# Patient Record
Sex: Male | Born: 1945 | Race: White | Hispanic: No | Marital: Married | Smoking: Never smoker
Health system: Southern US, Community
[De-identification: ages and names within clinical notes are randomized; demographics above are authoritative.]

## PROBLEM LIST (undated history)

## (undated) DIAGNOSIS — C801 Malignant (primary) neoplasm, unspecified: Secondary | ICD-10-CM

## (undated) DIAGNOSIS — N400 Enlarged prostate without lower urinary tract symptoms: Secondary | ICD-10-CM

## (undated) DIAGNOSIS — E119 Type 2 diabetes mellitus without complications: Secondary | ICD-10-CM

## (undated) DIAGNOSIS — C8190 Hodgkin lymphoma, unspecified, unspecified site: Secondary | ICD-10-CM

---

## 2016-08-14 ENCOUNTER — Other Ambulatory Visit: Payer: Self-pay

## 2016-08-14 ENCOUNTER — Inpatient Hospital Stay
Admission: EM | Admit: 2016-08-14 | Discharge: 2016-08-18 | DRG: 840 | Disposition: A | Discharge status: Hospice | Payer: Medicare Other | Attending: Internal Medicine | Admitting: Internal Medicine

## 2016-08-14 ENCOUNTER — Emergency Department: Payer: Medicare Other

## 2016-08-14 ENCOUNTER — Encounter: Payer: Self-pay | Admitting: *Deleted

## 2016-08-14 DIAGNOSIS — R627 Adult failure to thrive: Secondary | ICD-10-CM | POA: Diagnosis present

## 2016-08-14 DIAGNOSIS — E871 Hypo-osmolality and hyponatremia: Secondary | ICD-10-CM | POA: Diagnosis present

## 2016-08-14 DIAGNOSIS — E119 Type 2 diabetes mellitus without complications: Secondary | ICD-10-CM | POA: Diagnosis present

## 2016-08-14 DIAGNOSIS — N401 Enlarged prostate with lower urinary tract symptoms: Secondary | ICD-10-CM | POA: Diagnosis present

## 2016-08-14 DIAGNOSIS — R338 Other retention of urine: Secondary | ICD-10-CM | POA: Diagnosis present

## 2016-08-14 DIAGNOSIS — C8590 Non-Hodgkin lymphoma, unspecified, unspecified site: Secondary | ICD-10-CM | POA: Diagnosis present

## 2016-08-14 DIAGNOSIS — D649 Anemia, unspecified: Secondary | ICD-10-CM | POA: Diagnosis present

## 2016-08-14 DIAGNOSIS — Z515 Encounter for palliative care: Secondary | ICD-10-CM | POA: Diagnosis present

## 2016-08-14 DIAGNOSIS — Z6828 Body mass index (BMI) 28.0-28.9, adult: Secondary | ICD-10-CM

## 2016-08-14 DIAGNOSIS — Z881 Allergy status to other antibiotic agents status: Secondary | ICD-10-CM

## 2016-08-14 DIAGNOSIS — R55 Syncope and collapse: Secondary | ICD-10-CM

## 2016-08-14 DIAGNOSIS — C8190 Hodgkin lymphoma, unspecified, unspecified site: Secondary | ICD-10-CM | POA: Diagnosis not present

## 2016-08-14 DIAGNOSIS — E876 Hypokalemia: Secondary | ICD-10-CM | POA: Diagnosis present

## 2016-08-14 DIAGNOSIS — R61 Generalized hyperhidrosis: Secondary | ICD-10-CM | POA: Diagnosis present

## 2016-08-14 DIAGNOSIS — Z886 Allergy status to analgesic agent status: Secondary | ICD-10-CM | POA: Diagnosis not present

## 2016-08-14 DIAGNOSIS — E860 Dehydration: Secondary | ICD-10-CM | POA: Diagnosis present

## 2016-08-14 DIAGNOSIS — R1310 Dysphagia, unspecified: Secondary | ICD-10-CM | POA: Diagnosis present

## 2016-08-14 DIAGNOSIS — R531 Weakness: Secondary | ICD-10-CM | POA: Diagnosis present

## 2016-08-14 DIAGNOSIS — E43 Unspecified severe protein-calorie malnutrition: Secondary | ICD-10-CM | POA: Diagnosis present

## 2016-08-14 HISTORY — DX: Benign prostatic hyperplasia without lower urinary tract symptoms: N40.0

## 2016-08-14 HISTORY — DX: Hodgkin lymphoma, unspecified, unspecified site: C81.90

## 2016-08-14 HISTORY — DX: Type 2 diabetes mellitus without complications: E11.9

## 2016-08-14 HISTORY — DX: Malignant (primary) neoplasm, unspecified: C80.1

## 2016-08-14 LAB — BASIC METABOLIC PANEL
ANION GAP: 12 (ref 5–15)
BUN: 22 mg/dL — ABNORMAL HIGH (ref 6–20)
CALCIUM: 8.9 mg/dL (ref 8.9–10.3)
CO2: 23 mmol/L (ref 22–32)
CREATININE: 0.9 mg/dL (ref 0.61–1.24)
Chloride: 89 mmol/L — ABNORMAL LOW (ref 101–111)
Glucose, Bld: 225 mg/dL — ABNORMAL HIGH (ref 65–99)
Potassium: 4.4 mmol/L (ref 3.5–5.1)
SODIUM: 124 mmol/L — AB (ref 135–145)

## 2016-08-14 LAB — HEPATIC FUNCTION PANEL
ALK PHOS: 354 U/L — AB (ref 38–126)
ALT: 78 U/L — ABNORMAL HIGH (ref 17–63)
AST: 70 U/L — ABNORMAL HIGH (ref 15–41)
Albumin: 2.4 g/dL — ABNORMAL LOW (ref 3.5–5.0)
BILIRUBIN DIRECT: 0.7 mg/dL — AB (ref 0.1–0.5)
BILIRUBIN INDIRECT: 0.8 mg/dL (ref 0.3–0.9)
BILIRUBIN TOTAL: 1.5 mg/dL — AB (ref 0.3–1.2)
Total Protein: 6.2 g/dL — ABNORMAL LOW (ref 6.5–8.1)

## 2016-08-14 LAB — CBC
HCT: 25.3 % — ABNORMAL LOW (ref 40.0–52.0)
HEMOGLOBIN: 8.5 g/dL — AB (ref 13.0–18.0)
MCH: 28.2 pg (ref 26.0–34.0)
MCHC: 33.8 g/dL (ref 32.0–36.0)
MCV: 83.3 fL (ref 80.0–100.0)
Platelets: 176 10*3/uL (ref 150–440)
RBC: 3.03 MIL/uL — AB (ref 4.40–5.90)
RDW: 19.8 % — ABNORMAL HIGH (ref 11.5–14.5)
WBC: 6.4 10*3/uL (ref 3.8–10.6)

## 2016-08-14 LAB — TYPE AND SCREEN
ABO/RH(D): A POS
Antibody Screen: NEGATIVE

## 2016-08-14 LAB — URINALYSIS, COMPLETE (UACMP) WITH MICROSCOPIC
BILIRUBIN URINE: NEGATIVE
Bacteria, UA: NONE SEEN
GLUCOSE, UA: NEGATIVE mg/dL
Ketones, ur: 5 mg/dL — AB
LEUKOCYTES UA: NEGATIVE
Nitrite: NEGATIVE
PH: 5 (ref 5.0–8.0)
Protein, ur: 30 mg/dL — AB
SPECIFIC GRAVITY, URINE: 1.026 (ref 1.005–1.030)

## 2016-08-14 LAB — LIPASE, BLOOD: LIPASE: 24 U/L (ref 11–51)

## 2016-08-14 LAB — TROPONIN I: Troponin I: <0.03 ng/mL (ref <0.03)

## 2016-08-14 MED ORDER — TAMSULOSIN HCL 0.4 MG PO CAPS
0.4000 mg | ORAL_CAPSULE | Freq: Every day | ORAL | Status: DC
  Administered 2016-08-15: 0.4 mg via ORAL
  Filled 2016-08-14 (×2): qty 1

## 2016-08-14 MED ORDER — MORPHINE SULFATE 10 MG/5ML PO SOLN: 5.0000 mg | ORAL | Status: DC | PRN

## 2016-08-14 MED ORDER — FINASTERIDE 5 MG PO TABS
5.0000 mg | ORAL_TABLET | Freq: Every day | ORAL | Status: DC
  Administered 2016-08-15 – 2016-08-16 (×2): 5 mg via ORAL
  Filled 2016-08-14 (×2): qty 1

## 2016-08-14 MED ORDER — ONDANSETRON HCL 4 MG/2ML IJ SOLN: 4.0000 mg | Freq: Four times a day (QID) | INTRAMUSCULAR | Status: DC | PRN

## 2016-08-14 MED ORDER — ENSURE ENLIVE PO LIQD: 237.0000 mL | Freq: Two times a day (BID) | ORAL | Status: DC

## 2016-08-14 MED ORDER — ACETAMINOPHEN 325 MG PO TABS
650.0000 mg | ORAL_TABLET | Freq: Once | ORAL | Status: AC
  Administered 2016-08-14: 650 mg via ORAL

## 2016-08-14 MED ORDER — SODIUM CHLORIDE 0.9 % IV BOLUS (SEPSIS)
1000.0000 mL | Freq: Once | INTRAVENOUS | Status: AC
  Administered 2016-08-14: 1000 mL via INTRAVENOUS

## 2016-08-14 MED ORDER — ACETAMINOPHEN 325 MG PO TABS
ORAL_TABLET | ORAL | Status: AC
  Administered 2016-08-14: 650 mg via ORAL
  Filled 2016-08-14: qty 2

## 2016-08-14 MED ORDER — HYDROMORPHONE HCL 1 MG/ML IJ SOLN
1.0000 mg | INTRAMUSCULAR | Status: DC | PRN
  Administered 2016-08-14 – 2016-08-18 (×5): 1 mg via INTRAVENOUS
  Filled 2016-08-14 (×6): qty 1

## 2016-08-14 MED ORDER — ONDANSETRON HCL 4 MG PO TABS: 4.0000 mg | ORAL_TABLET | Freq: Four times a day (QID) | ORAL | Status: DC | PRN

## 2016-08-14 MED ORDER — ACETAMINOPHEN 500 MG PO TABS: 500.0000 mg | ORAL_TABLET | Freq: Four times a day (QID) | ORAL | Status: DC | PRN

## 2016-08-14 MED ORDER — MORPHINE SULFATE 10 MG/5ML PO SOLN
5.0000 mg | ORAL | Status: DC | PRN
  Administered 2016-08-14: 22:00:00 5 mg via ORAL
  Filled 2016-08-14: qty 5

## 2016-08-14 MED ORDER — GABAPENTIN 100 MG PO CAPS
100.0000 mg | ORAL_CAPSULE | Freq: Two times a day (BID) | ORAL | Status: DC
  Administered 2016-08-14 – 2016-08-16 (×4): 100 mg via ORAL
  Filled 2016-08-14 (×4): qty 1

## 2016-08-14 NOTE — Care Management Note (Signed)
Case Management Note  Patient Details  Name: Tyler Ford MRN: [id number] Date of Birth: August 20, 1945  Subjective/Objective:   Spoke to patient at bedside and he is adamant that he does not want to continue being a "burden to his wife  " by being cared for at home. He has declined chemo. That was offered . He has a low sodium of 124.  He is here wanting to explore options for hospice home . In checking with Santiago Glad from [facility], there are no Hospice Home beds available at this time.   She is starting a list. I have spoken to the family and patient, and told them we might bring the patient in to correct his sodium. They have agreed. There is the chance that he might feel better after this is corrected, or they may want to talk to the MD about comfort care, since they have expressed a desire NOT o pursue any aggressive care. Dr Margaretmary Eddy has been made aware and will see the patient.   Expected Discharge Date:                  Expected Discharge Plan:     In-House Referral:     Discharge planning Services     Post Acute Care Choice:    Choice offered to:     DME Arranged:    DME Agency:     HH Arranged:    HH Agency:     Status of Service:     If discussed at [employer], dates discussed:    Additional Comments:  Beau Fanny, RN 08/14/2016, 2:27 PM

## 2016-08-14 NOTE — H&P (Addendum)
[name] [place] NAME: Tyler Ford    MR#:  [id number]  DATE OF BIRTH:  [birth year]  DATE OF ADMISSION:  08/14/2016  PRIMARY CARE PHYSICIAN: Patient, No Pcp Per   REQUESTING/REFERRING PHYSICIAN: Orbie Pyo, MD  CHIEF COMPLAINT:  Generalized weakness with poor by mouth intake  HISTORY OF PRESENT ILLNESS:  Tyler Ford  is a 71 y.o. male with a known history of Recent diagnosis of Hodgkin's lymphoma at [facility], refusing chemotherapy, diabetes mellitus, benign prostatic hypertrophy is presenting to the ED with generalized weakness and poor by mouth intake Patient is unwilling to take chemotherapy because of the potential side affects. Patient has pursued immunotherapy and has been taking vitamins with no significant improvement. He has been experiencing generalized weakness and doing poorly. According to the son he has been taking on a liquid diet for the past several days. Patient is considering comfort care measures, reporting generalized body aches family members are unable to take care of him at home.  PAST MEDICAL HISTORY:   Past Medical History:  Diagnosis Date  . Cancer ([place])   . Diabetes mellitus without complication ([place])   . Enlarged prostate     PAST SURGICAL HISTOIRY:  History reviewed. No pertinent surgical history.  SOCIAL HISTORY:   Social History  Substance Use Topics  . Smoking status: Unknown If Ever Smoked  . Smokeless tobacco: Not on file  . Alcohol use No    FAMILY HISTORY:  History reviewed. No pertinent family history.  DRUG ALLERGIES:   Allergies  Allergen Reactions  . Ciprofloxacin Shortness Of Breath and Rash  . Ibuprofen Rash    REVIEW OF SYSTEMS:  CONSTITUTIONAL: Reporting generalized weakness and body aches EYES: No blurred or double vision.  EARS, NOSE, AND THROAT: No tinnitus or ear pain.  RESPIRATORY: No cough, shortness of breath, wheezing or hemoptysis.   CARDIOVASCULAR: No chest pain, orthopnea, edema.  GASTROINTESTINAL: No nausea, vomiting, diarrhea or abdominal pain.  GENITOURINARY: History of urinary retention ENDOCRINE: No polyuria, nocturia,  HEMATOLOGY: No anemia, easy bruising or bleeding SKIN: No rash or lesion. MUSCULOSKELETAL: Reporting body aches NEUROLOGIC: No tingling, numbness, weakness.  PSYCHIATRY: No anxiety or depression.   MEDICATIONS AT HOME:   Prior to Admission medications   Medication Sig Start Date End Date Taking? Authorizing Provider  acetaminophen (TYLENOL) 500 MG tablet Take 500 mg by mouth every 6 (six) hours as needed.   Yes [provider]  finasteride (PROSCAR) 5 MG tablet Take 5 mg by mouth daily.   Yes [provider]  gabapentin (NEURONTIN) 100 MG capsule Take 100 mg by mouth 2 (two) times daily.   Yes [provider]  ondansetron (ZOFRAN) 4 MG tablet Take 4 mg by mouth every 8 (eight) hours as needed for nausea or vomiting.   Yes [provider]  Oral Electrolytes (PEDIALYTE PO) Take by mouth.   Yes [provider]  tamsulosin (FLOMAX) 0.4 MG CAPS capsule Take 0.4 mg by mouth daily after breakfast.   Yes [provider]      VITAL SIGNS:  Blood pressure 114/63, pulse (!) 109, temperature 98.4 F (36.9 C), temperature source Oral, resp. rate 16, height 5\' 9"  (1.753 m), weight 81.6 kg (180 lb), SpO2 99 %.  PHYSICAL EXAMINATION:  GENERAL:  71 y.o.-year-old patient lying in the bed with no acute distress. Cachectic EYES: Pupils equal, round, reactive to light and accommodation. No scleral icterus. Extraocular muscles intact.  Dry mucous membranes HEENT: Head atraumatic, normocephalic. Oropharynx and nasopharynx clear.  NECK:  Supple, no jugular venous distention.   LUNGS: Normal breath sounds bilaterally, no wheezing, rales,rhonchi or crepitation. No use of accessory muscles of respiration.  CARDIOVASCULAR: S1, S2 normal. No murmurs, rubs, or  gallops.  ABDOMEN: Soft, nontender, nondistended. Bowel sounds present.EXTREMITIES: No pedal edema, cyanosis, or clubbing.  NEUROLOGIC: Diffuse weakness of all extremities, sensory intact oriented times 3  PSYCHIATRIC: The patient is alert and oriented x 3.  SKIN: Lymphadenopathy generalized   LABORATORY PANEL:   CBC  Recent Labs Lab 08/14/16 1141  WBC 6.4  HGB 8.5*  HCT 25.3*  PLT 176   ------------------------------------------------------------------------------------------------------------------  Chemistries   Recent Labs Lab 08/14/16 1141  NA 124*  K 4.4  CL 89*  CO2 23  GLUCOSE 225*  BUN 22*  CREATININE 0.90  CALCIUM 8.9  AST 70*  ALT 78*  ALKPHOS 354*  BILITOT 1.5*   ------------------------------------------------------------------------------------------------------------------  Cardiac Enzymes  Recent Labs Lab 08/14/16 1141  TROPONINI <0.03   ------------------------------------------------------------------------------------------------------------------  RADIOLOGY:  Dg Chest 1 View  Result Date: 08/14/2016 CLINICAL DATA:  Weakness.  Feeling bad. EXAM: CHEST 1 VIEW COMPARISON:  No recent prior . FINDINGS: Mediastinum and hilar structures normal. Lungs are clear. Heart size normal. No focal infiltrate. No pleural effusion or pneumothorax . IMPRESSION: No acute cardiopulmonary disease. Electronically Signed   By: Marcello Moores  Register   On: 08/14/2016 12:51    EKG:   Orders placed or performed during the hospital encounter of 08/14/16  . ED EKG  . ED EKG  . ED EKG  . ED EKG    IMPRESSION AND PLAN:   [name]  is a 71 y.o. male with a known history of Recent diagnosis of Hodgkin's lymphoma at [facility], refusing chemotherapy, diabetes mellitus, benign prostatic hypertrophy is presenting to the ED with generalized weakness and poor by mouth intake Patient is unwilling to take chemotherapy because of the potential side affects. Patient has  pursued immunotherapy and has been taking vitamins with no significant improvement. He has been experiencing generalized weakness and doing poorly.  #Failure to thrive-adult  #Hodgkin's lymphoma refusing chemotherapy  #Hyponatremia from poor by mouth intake and dehydration  #Severe protein energy malnutrition  #Benign prostatic hypertrophy  Admit to MedSurg unit Patient is requesting comfort care measures Consult palliative care, patient might be benefited with hospice home placement as her medication with the family members Provide morphine as needed for pain Tylenol for fever Zofran as needed for nausea and vomiting Diet supplements with ensure enlive, dietary consult Continue Flomax and Proscar to prevent urinary retention Oxygen supplement as needed for comfort care    All the records are reviewed and case discussed with ED provider. Management plans discussed with the patient, wife, son and grandson at bedside and they are in agreement.  CODE STATUS: DO NOT RESUSCITATE, comfort care, wife is the healthcare power of attorney  TOTAL TIME TAKING CARE OF THIS PATIENT: 48minutes.   Note: This dictation was prepared with Dragon dictation along with smaller phrase technology. Any transcriptional errors that result from this process are unintentional.  Nicholes Mango M.D on 08/14/2016 at 3:39 PM  Between 7am to 6pm - Pager - [phone]  After 6pm go to [email or web address] - password [email or web address] [facility]  Office  [phone]  CC: Primary care physician; Patient, No Pcp Per

## 2016-08-14 NOTE — Progress Notes (Signed)
[facility] Liaison:  RN visit  Received call from ED unit secretary who placed call through from Dr. Clearnce Hasten to M. Alford Highland, [facility] Liaison.  Potential home with hospice referral.  I went and spoke with family regarding same.  They were interested in residential hospice in [place].  They declined any services from [facility].    I relayed information to [place], [facility], who advised she would update Dr. Clearnce Hasten.    Thank you for your consideration,  Edyth Gunnels, RN, [facility] Liaison [phone]

## 2016-08-14 NOTE — Progress Notes (Signed)
[place] responded to consult for "End of Life Palliative Care." [facility] met with pt and pt.'s family that was at bedside. Pt talked about things he valued. 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 Pt also said that he was [employer] in [place] as well before he got sick. Pt stated that he has deep appreciation of churches that have [employer]. Pt had many things to say to [facility], which he said calmly, reflectively, and gracefully. Ch provided spiritual support and a ministry of presence. Pt would benefit from a [facility] follow up visit.   08/14/16 1900  Clinical Encounter Type  Visited With Patient and family together  Visit Type Initial;Other (Comment)  Referral From Nurse  Consult/Referral To Chaplain  Spiritual Encounters  Spiritual Needs Emotional;Other (Comment)

## 2016-08-14 NOTE — ED Notes (Signed)
Turned.  Linen changed.  Skin care given. NAD

## 2016-08-14 NOTE — ED Triage Notes (Signed)
Pt arrives with complaints of weakness and "feeling bad", states not feeling well for 2 months, wife states today his legs gave out today, pt appears weak and pale, foley in place, states some vomiting, states hx of anemia

## 2016-08-14 NOTE — ED Provider Notes (Signed)
[facility] Emergency Department Provider Note  ____________________________________________   First MD Initiated Contact with Patient 08/14/16 1151     (approximate)  I have reviewed the triage vital signs and the nursing notes.   HISTORY  Chief Complaint Weakness   HPI Tyler Ford is a 71 y.o. male with a history of Hodgkin's lymphoma, recently diagnosed at [facility], who is presenting with weakness and near syncope. He says that he was discharged from [facility] about one week ago after refusing chemotherapy. He is unwilling to take chemotherapy because of the potential side effects even if it means that he will die from his lymphoma. The patient is understanding of his diagnosis as well as the potential for death without the recommended treatments. Despite this, he has pursued obtaining on immunotherapy and has been taking vitamins and trying to set up home care. However, his family says that they're unable to care for him anymore especially after events this morning where he had a near-syncopal episode while walking and his "knees gave out on him." He was caught by family and did not have any impact against the ground. He is complaining of diffuse body aches which she says are chronic. Also complaining of night sweats.   Past Medical History:  Diagnosis Date  . Cancer ([place])   . Diabetes mellitus without complication ([place])   . Enlarged prostate     There are no active problems to display for this patient.   History reviewed. No pertinent surgical history.  Prior to Admission medications   Not on File    Allergies Ciprofloxacin and Ibuprofen  History reviewed. No pertinent family history.  Social History Social History  Substance Use Topics  . Smoking status: Unknown If Ever Smoked  . Smokeless tobacco: Not on file  . Alcohol use No    Review of Systems  Constitutional: No fever/chills Eyes: No visual changes. ENT: No sore  throat. Cardiovascular: Denies chest pain. Respiratory: short of breath with exertion.  Gastrointestinal:  No nausea, no vomiting.  No diarrhea.  No constipation. Genitourinary: Negative for dysuria. Musculoskeletal: Negative for back pain. Skin: Negative for rash. Neurological: Negative for headaches, focal weakness or numbness.   ____________________________________________   PHYSICAL EXAM:  VITAL SIGNS: ED Triage Vitals  Enc Vitals Group     BP 08/14/16 1141 110/62     Pulse Rate 08/14/16 1141 (!) 132     Resp 08/14/16 1141 18     Temp 08/14/16 1141 99 F (37.2 C)     Temp Source 08/14/16 1141 Oral     SpO2 08/14/16 1141 97 %     Weight 08/14/16 1137 180 lb (81.6 kg)     Height 08/14/16 1137 5\' 9"  (1.753 m)     Head Circumference --      Peak Flow --      Pain Score --      Pain Loc --      Pain Edu? --      Excl. in [place]? --     Constitutional: Alert and oriented. Well appearing and in no acute distress. Eyes: Conjunctivae are normal.  Head: Atraumatic. Nose: No congestion/rhinnorhea. Mouth/Throat: Mucous membranes are moist.  Neck: No stridor.   Cardiovascular: Tachycardic, regular rhythm. Grossly normal heart sounds. Intermittent PVCs. Respiratory: Normal respiratory effort.  No retractions. Lungs CTAB. Gastrointestinal: Soft and nontender. No distention. No CVA tenderness.  Patient with Foley with yellow urine in the Foley bag.  Musculoskeletal: No lower extremity tenderness  nor edema.  No joint effusions. Neurologic:  Normal speech and language. No gross focal neurologic deficits are appreciated. Skin:  Skin is warm, dry and intact. No rash noted. Psychiatric: Mood and affect are normal. Speech and behavior are normal.  ____________________________________________   LABS (all labs ordered are listed, but only abnormal results are displayed)  Labs Reviewed  BASIC METABOLIC PANEL - Abnormal; Notable for the following:       Result Value   Sodium 124 (*)     Chloride 89 (*)    Glucose, Bld 225 (*)    BUN 22 (*)    All other components within normal limits  CBC - Abnormal; Notable for the following:    RBC 3.03 (*)    Hemoglobin 8.5 (*)    HCT 25.3 (*)    RDW 19.8 (*)    All other components within normal limits  URINALYSIS, COMPLETE (UACMP) WITH MICROSCOPIC - Abnormal; Notable for the following:    Color, Urine AMBER (*)    APPearance CLOUDY (*)    Hgb urine dipstick LARGE (*)    Ketones, ur 5 (*)    Protein, ur 30 (*)    Squamous Epithelial / LPF 0-5 (*)    All other components within normal limits  CULTURE, BLOOD (ROUTINE X 2)  CULTURE, BLOOD (ROUTINE X 2)  HEPATIC FUNCTION PANEL  LIPASE, BLOOD  TROPONIN I  CBG MONITORING, ED  TYPE AND SCREEN   ____________________________________________  EKG  ED ECG REPORT I, Doran Stabler, the attending physician, personally viewed and interpreted this ECG.   Date: 08/14/2016  EKG Time: 1151  Rate: 127  Rhythm: sinus tachycardia  Axis: Normal  Intervals:none  ST&T Change: No ST segment elevation or depression. No abnormal T-wave inversion.  ____________________________________________  RADIOLOGY  No acute cardio pulmonary disease. ____________________________________________   PROCEDURES  Procedure(s) performed:   Procedures  Critical Care performed:  CRITICAL CARE Performed by: Doran Stabler   Total critical care time: 35 minutes  Critical care time was exclusive of separately billable procedures and treating other patients.  Critical care was necessary to treat or prevent imminent or life-threatening deterioration.  Critical care was time spent personally by me on the following activities: development of treatment plan with patient and/or surrogate as well as nursing, discussions with consultants, evaluation of patient's response to treatment, examination of patient, obtaining history from patient or surrogate, ordering and performing treatments and  interventions, ordering and review of laboratory studies, ordering and review of radiographic studies, pulse oximetry and re-evaluation of patient's condition.   ____________________________________________   INITIAL IMPRESSION / ASSESSMENT AND PLAN / ED COURSE  Pertinent labs & imaging results that were available during my care of the patient were reviewed by me and considered in my medical decision making (see chart for details).  ----------------------------------------- 1:23 PM on 08/14/2016 -----------------------------------------  Patient found to have a sodium of 124 which likely explains his weakness and near syncope. Hospice to see as the family does not want the patient had chemotherapy.     ----------------------------------------- 1:40 PM on 08/14/2016 -----------------------------------------  Discussed the case with Dr. Janese Banks of oncology here at [facility]. She says that Hodgkin's lymphoma should be highly reactive to chemotherapy and that immunotherapy is only a second line treatment. The patient and family have been refusing chemotherapy. There have been multiple other family members who have had negative results with chemotherapy with severe side effects. The patient realizes that if he does not pursue  chemotherapy at the end result may be that his disease will progress and he will die. However, he says that he already has difficulty eating solids and is only been taking boost shakes. He has never had chemotherapy and is unaware of how it would affect his body but is still refusing because of the potential side effects even though he realizes that the other outcome would likely be death from progression of his disease.  ----------------------------------------- 2:52 PM on 08/14/2016 -----------------------------------------  I discussed the case with the hospice corner, Haynes Dage, who agreed to come see the patient. However, the family is requesting care consistent  with a hospice house and there isat this time. Discussed the case with Malachy Mood, our care manager, was recommending admission to the hospital. Dr. Margaretmary Eddy and family are aware. Patient will be admitted. ____________________________________________   FINAL CLINICAL IMPRESSION(S) / ED DIAGNOSES  Near syncope. Hyponatremia.    NEW MEDICATIONS STARTED DURING THIS VISIT:  New Prescriptions   No medications on file     Note:  This document was prepared using Dragon voice recognition software and may include unintentional dictation errors.     Orbie Pyo, MD 08/14/16 [phone]

## 2016-08-15 NOTE — Progress Notes (Signed)
[facility] [place] NAME: Tyler Ford    MR#:  [id number]  DATE OF BIRTH:  1945-03-13  SUBJECTIVE:  CHIEF COMPLAINT:   Chief Complaint  Patient presents with  . Weakness    Came with generalized weakness, have lymphoma- Recently was admitted in [place] and decided not to have her chemotherapy but to have some immunotherapy. He was discharged home with home health from [place]. But in last 2-3 weeks family noted it is very hard to take care of him at home, he could not get up and walk. He is also not able to swallow solid food and has to only be on liquids. Finally tired of this and understanding the reality he came back to Hospital with his wish to go for hospice home.  REVIEW OF SYSTEMS:  CONSTITUTIONAL: No fever,positive for fatigue or weakness.  EYES: No blurred or double vision.  EARS, NOSE, AND THROAT: No tinnitus or ear pain.  RESPIRATORY: No cough, shortness of breath, wheezing or hemoptysis.  CARDIOVASCULAR: No chest pain, orthopnea, edema.  GASTROINTESTINAL: No nausea, vomiting, diarrhea or abdominal pain.  GENITOURINARY: No dysuria, hematuria.  ENDOCRINE: No polyuria, nocturia,  HEMATOLOGY: No anemia, easy bruising or bleeding SKIN: No rash or lesion. MUSCULOSKELETAL: No joint pain or arthritis.   NEUROLOGIC: No tingling, numbness, weakness.  PSYCHIATRY: No anxiety or depression.   ROS  DRUG ALLERGIES:   Allergies  Allergen Reactions  . Ciprofloxacin Shortness Of Breath and Rash  . Ibuprofen Rash    VITALS:  Blood pressure (!) 102/58, pulse (!) 121, temperature 99.6 F (37.6 C), temperature source Oral, resp. rate 17, height 5\' 9"  (1.753 m), weight 87 kg (191 lb 12.8 oz), SpO2 97 %.  PHYSICAL EXAMINATION:   GENERAL:  71 y.o.-year-old patient lying in the bed with no acute distress. Cachectic EYES: Pupils equal, round, reactive to light and accommodation. No scleral icterus. Extraocular muscles intact. Dry mucous  membranes HEENT: Head atraumatic, normocephalic. Oropharynx and nasopharynx clear.  NECK:  Supple, no jugular venous distention.   LUNGS: Normal breath sounds bilaterally, no wheezing, rales,rhonchi or crepitation. No use of accessory muscles of respiration.  CARDIOVASCULAR: S1, S2 normal. No murmurs, rubs, or gallops.  ABDOMEN: Soft, nontender, nondistended. Bowel sounds present.EXTREMITIES: No pedal edema, cyanosis, or clubbing.  NEUROLOGIC: Diffuse weakness of all extremities, sensory intact oriented times 3  PSYCHIATRIC: The patient is alert and oriented x 3.  SKIN: Lymphadenopathy generalized   Physical Exam LABORATORY PANEL:   CBC  Recent Labs Lab 08/14/16 1141  WBC 6.4  HGB 8.5*  HCT 25.3*  PLT 176   ------------------------------------------------------------------------------------------------------------------  Chemistries   Recent Labs Lab 08/14/16 1141  NA 124*  K 4.4  CL 89*  CO2 23  GLUCOSE 225*  BUN 22*  CREATININE 0.90  CALCIUM 8.9  AST 70*  ALT 78*  ALKPHOS 354*  BILITOT 1.5*   ------------------------------------------------------------------------------------------------------------------  Cardiac Enzymes  Recent Labs Lab 08/14/16 1141  TROPONINI <0.03   ------------------------------------------------------------------------------------------------------------------  RADIOLOGY:  Dg Chest 1 View  Result Date: 08/14/2016 CLINICAL DATA:  Weakness.  Feeling bad. EXAM: CHEST 1 VIEW COMPARISON:  No recent prior . FINDINGS: Mediastinum and hilar structures normal. Lungs are clear. Heart size normal. No focal infiltrate. No pleural effusion or pneumothorax . IMPRESSION: No acute cardiopulmonary disease. Electronically Signed   By: Marcello Moores  Register   On: 08/14/2016 12:51    ASSESSMENT AND PLAN:   Active Problems:   Lymphoma ([place])  Tyler Ford  is a 71 y.o. male with a known history of Recent diagnosis of Hodgkin's lymphoma at [facility],  refusing chemotherapy, diabetes mellitus, benign prostatic hypertrophy is presenting to the ED with generalized weakness and poor by mouth intake Patient is unwilling to take chemotherapy because of the potential side affects. Patient has pursued immunotherapy and has been taking vitamins with no significant improvement. He has been experiencing generalized weakness and doing poorly.  #Failure to thrive-adult #Hodgkin's lymphoma refusing chemotherapy    He agreed for hospice level of care. I discussed with hospice at home up she and her, but he said his family is not able to take care of him as he cannot walk at all. And he would prefer to go to hospice home. IV discussed with social worker, currently in the hospice facility locally there are no beds available, she will place him on the waiting list there and if patient is still here until Monday then he will get palliative care consult.  #Hyponatremia from poor by mouth intake and dehydration  IV fluids  #Severe protein energy malnutrition Diet supplements with ensure enlive, dietary consult  #Benign prostatic hypertrophy Continue Flomax and Proscar to prevent urinary retention    All the records are reviewed and case discussed with Care Management/Social Workerr. Management plans discussed with the patient, family and they are in agreement.  CODE STATUS: DNR  TOTAL TIME TAKING CARE OF THIS PATIENT: 35 minutes.    POSSIBLE D/C IN 1-2 DAYS, DEPENDING ON CLINICAL CONDITION.   Vaughan Basta M.D on 08/15/2016   Between 7am to 6pm - Pager - [phone]  After 6pm go to [email or web address] - password [email or web address] [facility]  Office  [phone]  CC: Primary care physician; Patient, No Pcp Per  Note: This dictation was prepared with Dragon dictation along with smaller phrase technology. Any transcriptional errors that result from this process are unintentional.

## 2016-08-15 NOTE — Clinical Social Work Note (Signed)
CSW received a verbal consult from the attending MD that the patient is requesting a hospice referral for facility-based hospice. The CSW has sent the referral to [facility]. Currently, according to Jackelyn Poling, the weekend admissions coordinator, the facility has no available beds. However, the patient may be put on the waiting list for a pending bed opening. CSW will continue to follow for possible hospice discharge.  Santiago Bumpers, MSW, Latanya Presser [phone]

## 2016-08-15 NOTE — Plan of Care (Signed)
Problem: Health Behavior/Discharge Planning: Goal: Ability to manage health-related needs will improve Outcome: Progressing Patient to discharge to hospice home when bed is available.  Problem: Pain Managment: Goal: General experience of comfort will improve Outcome: Progressing No c/o pain this shift, patient resting with family at bedside.

## 2016-08-16 MED ORDER — MORPHINE SULFATE (CONCENTRATE) 10 MG/0.5ML PO SOLN
10.0000 mg | ORAL | Status: DC | PRN
  Administered 2016-08-16: 21:00:00 10 mg via ORAL
  Filled 2016-08-16: qty 1

## 2016-08-16 NOTE — Plan of Care (Signed)
Problem: Health Behavior/Discharge Planning: Goal: Ability to manage health-related needs will improve Outcome: Not Progressing Pt is declining. Pt is DNR. Pt has requested to be transferred to hospice home as soon as bed is available.   Problem: Pain Managment: Goal: General experience of comfort will improve Outcome: Progressing Pt c/o pain in BL feet. Pt requested Gabapentin per scheduled order. Pt resting comfortably with family at bedside. Slightly elevated temp 100.5, temp in room decreased per pt request. Temp recheck 100.2. Continue to monitor.   Problem: Physical Regulation: Goal: Ability to maintain clinical measurements within normal limits will improve Outcome: Not Progressing Pt is declining. Pt is DNR. Pt has requested to be transferred to hospice home as soon as bed is available.   Problem: Tissue Perfusion: Goal: Risk factors for ineffective tissue perfusion will decrease Outcome: Not Progressing Pt is declining. Pt is DNR. Pt has requested to be transferred to hospice home as soon as bed is available.  Problem: Fluid Volume: Goal: Ability to maintain a balanced intake and output will improve Outcome: Not Progressing Pt is declining. Pt is DNR. Pt has requested to be transferred to hospice home as soon as bed is available.   Problem: Nutrition: Goal: Adequate nutrition will be maintained Outcome: Not Progressing Pt is declining. Pt is DNR. Pt has requested to be transferred to hospice home as soon as bed is available.   Problem: Bowel/Gastric: Goal: Will not experience complications related to bowel motility Outcome: Progressing Last BM 08/14/2016  Problem: Spiritual Needs Goal: Ability to function at adequate level Outcome: Not Progressing Pt is declining. Pt is DNR. Pt has requested to be transferred to hospice home as soon as bed is available.

## 2016-08-16 NOTE — Progress Notes (Signed)
[facility] [place] NAME: Tyler Ford    MR#:  [id number]  DATE OF BIRTH:  [birth year]  SUBJECTIVE:  CHIEF COMPLAINT:   Chief Complaint  Patient presents with  . Weakness    Came with generalized weakness, have lymphoma- Recently was admitted in [place] and decided not to have her chemotherapy but to have some immunotherapy. He was discharged home with home health from [place]. But in last 2-3 weeks family noted it is very hard to take care of him at home, he could not get up and walk. He is also not able to swallow solid food and has to only be on liquids. Finally tired of this and understanding the reality he came back to Hospital with his wish to go for hospice home.   Has a foley placed since in [place] for last 3 weeks.  REVIEW OF SYSTEMS:  CONSTITUTIONAL: No fever,positive for fatigue or weakness.  EYES: No blurred or double vision.  EARS, NOSE, AND THROAT: No tinnitus or ear pain.  RESPIRATORY: No cough, shortness of breath, wheezing or hemoptysis.  CARDIOVASCULAR: No chest pain, orthopnea, edema.  GASTROINTESTINAL: No nausea, vomiting, diarrhea or abdominal pain.  GENITOURINARY: No dysuria, hematuria.  ENDOCRINE: No polyuria, nocturia,  HEMATOLOGY: No anemia, easy bruising or bleeding SKIN: No rash or lesion. MUSCULOSKELETAL: No joint pain or arthritis.   NEUROLOGIC: No tingling, numbness, weakness.  PSYCHIATRY: No anxiety or depression.   ROS  DRUG ALLERGIES:   Allergies  Allergen Reactions  . Ciprofloxacin Shortness Of Breath and Rash  . Ibuprofen Rash    VITALS:  Blood pressure (!) 109/56, pulse (!) 110, temperature 99.1 F (37.3 C), temperature source Oral, resp. rate 16, height 5\' 9"  (1.753 m), weight 87 kg (191 lb 12.8 oz), SpO2 97 %.  PHYSICAL EXAMINATION:   GENERAL:  72 y.o.-year-old patient lying in the bed with no acute distress. Cachectic EYES: Pupils equal, round, reactive to light and accommodation. No scleral  icterus. Extraocular muscles intact. Dry mucous membranes HEENT: Head atraumatic, normocephalic. Oropharynx and nasopharynx clear.  NECK:  Supple, no jugular venous distention.   LUNGS: Normal breath sounds bilaterally, no wheezing, rales,rhonchi or crepitation. No use of accessory muscles of respiration.  CARDIOVASCULAR: S1, S2 normal. No murmurs, rubs, or gallops.  ABDOMEN: Soft, nontender, nondistended. Bowel sounds present. Foley in place. EXTREMITIES: No pedal edema, cyanosis, or clubbing.  NEUROLOGIC: Diffuse weakness of all extremities, sensory intact oriented times 3  PSYCHIATRIC: The patient is alert and oriented x 3.  SKIN: Lymphadenopathy generalized   Physical Exam LABORATORY PANEL:   CBC  Recent Labs Lab 08/14/16 1141  WBC 6.4  HGB 8.5*  HCT 25.3*  PLT 176   ------------------------------------------------------------------------------------------------------------------  Chemistries   Recent Labs Lab 08/14/16 1141  NA 124*  K 4.4  CL 89*  CO2 23  GLUCOSE 225*  BUN 22*  CREATININE 0.90  CALCIUM 8.9  AST 70*  ALT 78*  ALKPHOS 354*  BILITOT 1.5*   ------------------------------------------------------------------------------------------------------------------  Cardiac Enzymes  Recent Labs Lab 08/14/16 1141  TROPONINI <0.03   ------------------------------------------------------------------------------------------------------------------  RADIOLOGY:  Dg Chest 1 View  Result Date: 08/14/2016 CLINICAL DATA:  Weakness.  Feeling bad. EXAM: CHEST 1 VIEW COMPARISON:  No recent prior . FINDINGS: Mediastinum and hilar structures normal. Lungs are clear. Heart size normal. No focal infiltrate. No pleural effusion or pneumothorax . IMPRESSION: No acute cardiopulmonary disease. Electronically Signed   By: Marcello Moores  Register   On: 08/14/2016 12:51  ASSESSMENT AND PLAN:   Active Problems:   Lymphoma ([place])  Tyler Ford  is a 71 y.o. male with a known  history of Recent diagnosis of Hodgkin's lymphoma at [facility], refusing chemotherapy, diabetes mellitus, benign prostatic hypertrophy is presenting to the ED with generalized weakness and poor by mouth intake Patient is unwilling to take chemotherapy because of the potential side affects. Patient has pursued immunotherapy and has been taking vitamins with no significant improvement. He has been experiencing generalized weakness and doing poorly.  #Failure to thrive-adult   Only able to drink few sips of liquids.  #Hodgkin's lymphoma refusing chemotherapy    He agreed for hospice level of care. I discussed with hospice at home , but he said his family is not able to take care of him as he cannot walk at all. And he would prefer to go to hospice home. IV discussed with social worker, currently in the hospice facility locally there are no beds available, she will place him on the waiting list there and if patient is still here until Monday then he will get palliative care consult.   Currently manage on comfort care in hospital until then.  #Hyponatremia from poor by mouth intake and dehydration  comfort care now.  #Severe protein energy malnutrition   #Benign prostatic hypertrophy Was on Flomax and Proscar to prevent urinary retention   Have a foley for last 3 weeks, cont foley now for comfort, stop meds,.   All the records are reviewed and case discussed with Care Management/Social Workerr. Management plans discussed with the patient, family and they are in agreement.  CODE STATUS: DNR  TOTAL TIME TAKING CARE OF THIS PATIENT: 25 minutes.    POSSIBLE D/C IN 1-2 DAYS, DEPENDING ON CLINICAL CONDITION.   Vaughan Basta M.D on 08/16/2016   Between 7am to 6pm - Pager - [phone]  After 6pm go to [email or web address] - password [email or web address] [facility]  Office  [phone]  CC: Primary care physician; Patient, No Pcp Per  Note: This dictation was  prepared with Dragon dictation along with smaller phrase technology. Any transcriptional errors that result from this process are unintentional.

## 2016-08-16 NOTE — Plan of Care (Signed)
Problem: Education: Goal: Knowledge of the prescribed therapeutic regimen will improve Outcome: Progressing Pt made Comfort Care by Dr Anselm Jungling this shift; education information previously given by M. Cobb RN regarding dying process "Gone From My Sight"

## 2016-08-16 NOTE — Progress Notes (Signed)
Nutrition Brief Note  Received consult for Poor PO. Chart reviewed and discussed with RN. Patient now transitioning to comfort care.   Will discontinue order for Ensure. RN reports patient is not drinking them. No nutrition interventions warranted at this time. Please consult RD as needed.   Willey Blade, MS, RD, LDN Pager: [phone] After Hours Pager: [phone]

## 2016-08-16 NOTE — Progress Notes (Signed)
Pt A&O, c/o BL foot pain. Pt states that scheduled Gabapentin does help, refused all other PRN pain medications. Pt noted with slightly elevated temperature and requested room temperature to be decreased. Recheck temp was 100.2, will continue to monitor. Pt resting comfortably with family at bedside. Foley draining yellow urine without difficulty. Pt has requested to be transferred to hospice home when bed is available. Pt is a high fall risk with yellow arm band and socks in place. Bed alarm is activated. Pt also has red allergy and purple DNR arm bands on.

## 2016-08-16 NOTE — Progress Notes (Signed)
Pt family at bedside. Concerned with pt having pain medication. Clarified that pt has not requested prn pain medication. Son stated that his father appears to be comfortable and in no need of medication, he was only wanting to make sure that none had been given. Spoke with son in length about the dying process, Gone from my sight booklet given with questions answered. Reassured the pt's son that if he had any further questions to please ask or if he felt that his father was in pain to call me. Son verbalized understanding and voiced appreciation to all of staff for compassion being shown to his father.

## 2016-08-16 NOTE — Clinical Social Work Note (Signed)
CSW received notification from [facility] that the patient will need a palliative care assessment due to no clear indication that the patient is deemed as having fewer than 2 weeks until end-of-life. CSW has updated incoming CSW for the week to follow-up with the liaison for assessment.  Santiago Bumpers, MSW, Latanya Presser [phone]

## 2016-08-16 NOTE — Plan of Care (Signed)
Problem: Coping: Goal: Ability to identify and develop effective coping behavior will improve Outcome: Progressing Gone from my sight booklet given to family. Questions answered with encouragement to ask any further questions.

## 2016-08-17 MED ORDER — MORPHINE SULFATE (CONCENTRATE) 10 MG/0.5ML PO SOLN: 10.0000 mg | ORAL | 0 refills | Status: AC | PRN

## 2016-08-17 MED ORDER — MORPHINE SULFATE (CONCENTRATE) 10 MG/0.5ML PO SOLN: 10.0000 mg | ORAL | Status: DC | PRN

## 2016-08-17 MED ORDER — LORAZEPAM 1 MG PO TABS: 1.0000 mg | ORAL_TABLET | Freq: Four times a day (QID) | ORAL | 0 refills | Status: AC | PRN

## 2016-08-17 NOTE — Discharge Summary (Signed)
[place] at [place] NAME: Tyler Ford    MR#:  [id number]  DATE OF BIRTH:  1945-05-24  DATE OF ADMISSION:  08/14/2016   ADMITTING PHYSICIAN: Nicholes Mango, MD  DATE OF DISCHARGE: 08/17/2016  PRIMARY CARE PHYSICIAN: Patient, No Pcp Per   ADMISSION DIAGNOSIS:   Hyponatremia [E87.1] Near syncope [R55]  DISCHARGE DIAGNOSIS:   Active Problems:   Lymphoma ([place])   SECONDARY DIAGNOSIS:   Past Medical History:  Diagnosis Date  . Cancer ([place])   . Diabetes mellitus without complication ([place])   . Enlarged prostate   . Hodgkin lymphoma [facility])     HOSPITAL COURSE:   71 year old male with past medical history significant for Hodgkin's lymphoma, hyponatremia, BPH, protein calorie malnutrition, anemia requiring transfusion was recently admitted at [facility] and refused chemotherapy for his lymphoma presents secondary to weakness and opting for hospice.  #1 Hodgkin's lymphoma-seen by oncology and 2, status post biopsy revealing Hodgkin's lymphoma. Enlargement of lymph nodes noted in neck chest, abdomen and pelvis concerning for metastatic disease. -Also there is diffuse multifocal metastatic disease in axillary skeleton aced on the PET scan on 07/27/16 -However patient has refused chemotherapy and immunotherapy at this time. -Palliative care consult pending. Likely hospice evaluation - on comfort meds  #2 hyponatremia and hypokalemia on admission-since refused treatment and is opting for hospice, no further follow-up labs were done  #3 acute on chronic anemia-required transfusion at [facility] 2 weeks ago. -Hemoglobin on admission was 8.5. Again since hospice candidate, no further blood work has been done based on patient's choices.  #4 severe protein calorie malnutrition-albumin of 2.4, prealbumin was low at [facility] as well. -Also has significant dysphagia. Currently only on liquid diet.  #5 Urinary retention- foley for  comfort   Possible discharge to hospice home when bed available   DISCHARGE CONDITIONS:   Guarded  CONSULTS OBTAINED:    None  DRUG ALLERGIES:   Allergies  Allergen Reactions  . Ciprofloxacin Shortness Of Breath and Rash  . Ibuprofen Rash   DISCHARGE MEDICATIONS:   Allergies as of 08/17/2016      Reactions   Ciprofloxacin Shortness Of Breath, Rash   Ibuprofen Rash      Medication List    STOP taking these medications   finasteride 5 MG tablet Commonly known as:  PROSCAR   gabapentin 100 MG capsule Commonly known as:  NEURONTIN   PEDIALYTE PO   tamsulosin 0.4 MG Caps capsule Commonly known as:  FLOMAX     TAKE these medications   acetaminophen 500 MG tablet Commonly known as:  TYLENOL Take 500 mg by mouth every 6 (six) hours as needed.   LORazepam 1 MG tablet Commonly known as:  ATIVAN Take 1 tablet (1 mg total) by mouth every 6 (six) hours as needed for anxiety.   morphine CONCENTRATE 10 MG/0.5ML Soln concentrated solution Take 0.5 mLs (10 mg total) by mouth every 2 (two) hours as needed for severe pain, anxiety or shortness of breath.   ondansetron 4 MG tablet Commonly known as:  ZOFRAN Take 4 mg by mouth every 8 (eight) hours as needed for nausea or vomiting.        DISCHARGE INSTRUCTIONS:   1. Discharge to Hospice home  DIET:   Full Liquid diet  ACTIVITY:   Bedrest  OXYGEN:   Home Oxygen: No.  Oxygen Delivery: room air  DISCHARGE LOCATION:   Hospice home   If you experience worsening of your  admission symptoms, develop shortness of breath, life threatening emergency, suicidal or homicidal thoughts you must seek medical attention immediately by calling 911 or calling your MD immediately  if symptoms less severe.  You Must read complete instructions/literature along with all the possible adverse reactions/side effects for all the Medicines you take and that have been prescribed to you. Take any new Medicines after you have  completely understood and accpet all the possible adverse reactions/side effects.   Please note  You were cared for by a hospitalist during your hospital stay. If you have any questions about your discharge medications or the care you received while you were in the hospital after you are discharged, you can call the unit and asked to speak with the hospitalist on call if the hospitalist that took care of you is not available. Once you are discharged, your primary care physician will handle any further medical issues. Please note that NO REFILLS for any discharge medications will be authorized once you are discharged, as it is imperative that you return to your primary care physician (or establish a relationship with a primary care physician if you do not have one) for your aftercare needs so that they can reassess your need for medications and monitor your lab values.    On the day of Discharge:  VITAL SIGNS:   Blood pressure (!) 94/51, pulse (!) 110, temperature 98.1 F (36.7 C), temperature source Oral, resp. rate (!) 24, height 5\' 9"  (1.753 m), weight 87 kg (191 lb 12.8 oz), SpO2 94 %.  PHYSICAL EXAMINATION:    GENERAL:  71 y.o.-year-old ill appearing patient lying in the bed with no acute distress.  EYES: Pupils equal, round, reactive to light and accommodation. No scleral icterus. Extraocular muscles intact.  HEENT: Head atraumatic, normocephalic. Oropharynx and nasopharynx clear. Dry mucous membranes NECK:  Supple, no jugular venous distention. No thyroid enlargement, no tenderness. Low hoarse voice LUNGS: Normal breath sounds bilaterally, no wheezing, rales,rhonchi or crepitation. No use of accessory muscles of respiration. Decreased bibasilar breath sounds CARDIOVASCULAR: S1, S2 normal. No murmurs, rubs, or gallops.  ABDOMEN: Soft, nontender, nondistended. Bowel sounds present. No organomegaly or mass.  EXTREMITIES: No pedal edema, cyanosis, or clubbing.  NEUROLOGIC: Cranial nerves  II through XII are intact. Muscle strength 5/5 in all extremities. Sensation intact. Gait not checked. Global weakness noted PSYCHIATRIC: The patient is alert and oriented x 3.  SKIN: No obvious rash, lesion, or ulcer.     DATA REVIEW:   CBC  Recent Labs Lab 08/14/16 1141  WBC 6.4  HGB 8.5*  HCT 25.3*  PLT 176    Chemistries   Recent Labs Lab 08/14/16 1141  NA 124*  K 4.4  CL 89*  CO2 23  GLUCOSE 225*  BUN 22*  CREATININE 0.90  CALCIUM 8.9  AST 70*  ALT 78*  ALKPHOS 354*  BILITOT 1.5*     Microbiology Results  Results for orders placed or performed during the hospital encounter of 08/14/16  Blood culture (routine x 2)     Status: None (Preliminary result)   Collection Time: 08/14/16 12:39 PM  Result Value Ref Range Status   Specimen Description BLOOD [facility]  Final   Special Requests   Final    BOTTLES DRAWN AEROBIC AND ANAEROBIC Blood Culture adequate volume   Culture NO GROWTH 3 DAYS  Final   Report Status PENDING  Incomplete  Blood culture (routine x 2)     Status: None (Preliminary result)   Collection Time:  08/14/16 12:39 PM  Result Value Ref Range Status   Specimen Description BLOOD [facility]  Final   Special Requests   Final    BOTTLES DRAWN AEROBIC AND ANAEROBIC Blood Culture adequate volume   Culture NO GROWTH 3 DAYS  Final   Report Status PENDING  Incomplete    RADIOLOGY:  No results found.   Management plans discussed with the patient, family and they are in agreement.  CODE STATUS:     Code Status Orders        Start     Ordered   08/14/16 1818  Do not attempt resuscitation (DNR)  Continuous    Question Answer Comment  In the event of cardiac or respiratory ARREST Do not call a "code blue"   In the event of cardiac or respiratory ARREST Do not perform Intubation, CPR, defibrillation or ACLS   In the event of cardiac or respiratory ARREST Use medication by any route, position, wound care, and other measures to relive pain and suffering.  May use oxygen, suction and manual treatment of airway obstruction as needed for comfort.   Comments RN may pronounce      08/14/16 1817    Code Status History    Date Active Date Inactive Code Status Order ID Comments User Context   This patient has a current code status but no historical code status.      TOTAL TIME TAKING CARE OF THIS PATIENT: 37 minutes.    Tyler Ford M.D on 08/17/2016 at 9:29 AM  Between 7am to 6pm - Pager - [phone]  After 6pm go to [email or web address] - Proofreader  [facility]  Office  [phone]  CC: Primary care physician; Patient, No Pcp Per   Note: This dictation was prepared with Dragon dictation along with smaller phrase technology. Any transcriptional errors that result from this process are unintentional.

## 2016-08-17 NOTE — Progress Notes (Signed)
Palliative Medicine consult noted. Due to high referral volume, there may be a delay seeing this patient. Please call the Palliative Medicine Team office at [phone] if recommendations are needed in the interim.  Thank you for inviting us to see this patient.  Vasilios Ottaway G. Voncille Simm, RN, BSN, CHPN 08/17/2016 9:57 AM Cell [phone] 8:00-4:00 Monday-Friday Office [phone] 

## 2016-08-17 NOTE — Progress Notes (Signed)
[place] made an initial visit on referral from the [facility]. Pt was resting in bed with two family members bedside. Pt appeared tired as he stated there had been many visits from MD and RN's earlier today. [place] offered a time for rest which was appreciated. CH said a silent prayer and is available for follow up.    08/17/16 1300  Clinical Encounter Type  Visited With Patient and family together  Visit Type Initial;Spiritual support  Referral From Chaplain  Consult/Referral To Chaplain  Spiritual Encounters  Spiritual Needs Emotional

## 2016-08-17 NOTE — Progress Notes (Signed)
[place] at [place] NAME: Tyler Ford    MR#:  [id number]  DATE OF BIRTH:  [birth year]  SUBJECTIVE:  CHIEF COMPLAINT:   Chief Complaint  Patient presents with  . Weakness   -Has  lymphoma with abdominal lymphadenopathy, patient has opted for hospice at this time -Awaiting palliative care input  REVIEW OF SYSTEMS:  Review of Systems  Constitutional: Positive for malaise/fatigue. Negative for chills and fever.  HENT: Positive for sore throat. Negative for congestion, ear discharge, hearing loss and nosebleeds.   Respiratory: Positive for shortness of breath. Negative for cough and wheezing.   Cardiovascular: Negative for chest pain, palpitations and leg swelling.  Gastrointestinal: Negative for abdominal pain, constipation, diarrhea, nausea and vomiting.  Genitourinary: Negative for dysuria.  Musculoskeletal: Positive for myalgias.  Neurological: Positive for weakness. Negative for dizziness, speech change, focal weakness, seizures and headaches.  Psychiatric/Behavioral: Negative for depression.    DRUG ALLERGIES:   Allergies  Allergen Reactions  . Ciprofloxacin Shortness Of Breath and Rash  . Ibuprofen Rash    VITALS:  Blood pressure (!) 94/51, pulse (!) 110, temperature 98.1 F (36.7 C), temperature source Oral, resp. rate (!) 24, height 5\' 9"  (1.753 m), weight 87 kg (191 lb 12.8 oz), SpO2 94 %.  PHYSICAL EXAMINATION:  Physical Exam  GENERAL:  71 y.o.-year-old ill appearing patient lying in the bed with no acute distress.  EYES: Pupils equal, round, reactive to light and accommodation. No scleral icterus. Extraocular muscles intact.  HEENT: Head atraumatic, normocephalic. Oropharynx and nasopharynx clear. Dry mucous membranes NECK:  Supple, no jugular venous distention. No thyroid enlargement, no tenderness. Low hoarse voice LUNGS: Normal breath sounds bilaterally, no wheezing, rales,rhonchi or crepitation. No use of  accessory muscles of respiration. Decreased bibasilar breath sounds CARDIOVASCULAR: S1, S2 normal. No murmurs, rubs, or gallops.  ABDOMEN: Soft, nontender, nondistended. Bowel sounds present. No organomegaly or mass.  EXTREMITIES: No pedal edema, cyanosis, or clubbing.  NEUROLOGIC: Cranial nerves II through XII are intact. Muscle strength 5/5 in all extremities. Sensation intact. Gait not checked. Global weakness noted PSYCHIATRIC: The patient is alert and oriented x 3.  SKIN: No obvious rash, lesion, or ulcer.    LABORATORY PANEL:   CBC  Recent Labs Lab 08/14/16 1141  WBC 6.4  HGB 8.5*  HCT 25.3*  PLT 176   ------------------------------------------------------------------------------------------------------------------  Chemistries   Recent Labs Lab 08/14/16 1141  NA 124*  K 4.4  CL 89*  CO2 23  GLUCOSE 225*  BUN 22*  CREATININE 0.90  CALCIUM 8.9  AST 70*  ALT 78*  ALKPHOS 354*  BILITOT 1.5*   ------------------------------------------------------------------------------------------------------------------  Cardiac Enzymes  Recent Labs Lab 08/14/16 1141  TROPONINI <0.03   ------------------------------------------------------------------------------------------------------------------  RADIOLOGY:  No results found.  EKG:   Orders placed or performed during the hospital encounter of 08/14/16  . ED EKG  . ED EKG  . ED EKG  . ED EKG    ASSESSMENT AND PLAN:   70 year old male with past medical history significant for Hodgkin's lymphoma, hyponatremia, BPH, protein calorie malnutrition, anemia requiring transfusion was recently admitted at [facility] and refused chemotherapy for his lymphoma presents secondary to weakness and opting for hospice.  #1 Hodgkin's lymphoma-seen by oncology and 2, status post biopsy revealing Hodgkin's lymphoma. Enlargement of lymph nodes noted in neck chest, abdomen and pelvis concerning for metastatic disease. -Also  there is diffuse multifocal metastatic disease in axillary skeleton aced on the PET scan on  07/27/16 -However patient has refused chemotherapy and immunotherapy at this time. -Palliative care consult pending. Likely hospice evaluation  #2 hyponatremia and hypokalemia on admission-since refused treatment and is opting for hospice, no further follow-up labs were done  #3 acute on chronic anemia-required transfusion at [facility] 2 weeks ago. -Hemoglobin on admission was 8.5. Again since hospice candidate, no further blood work has been done based on patient's choices.  #4 severe protein calorie malnutrition-albumin of 2.4, prealbumin was low at [facility] as well. -Also has significant dysphagia. Currently only on liquid diet.  #5 DVT prophylaxis-patient is on comfort care  Palliative care evaluation and possible referral to hospice home    All the records are reviewed and case discussed with Care Management/Social Workerr. Management plans discussed with the patient, family and they are in agreement.  CODE STATUS: DO NOT RESUSCITATE  TOTAL TIME TAKING CARE OF THIS PATIENT: 36 minutes.   POSSIBLE D/C today or tomorrow, DEPENDING ON CLINICAL CONDITION.   Melodi Happel M.D on 08/17/2016 at 9:18 AM  Between 7am to 6pm - Pager - [phone]  After 6pm go to [email or web address] - password [email or web address] [facility]  Office  [phone]  CC: Primary care physician; Patient, No Pcp Per

## 2016-08-17 NOTE — Progress Notes (Signed)
New hospice home referral received by weekend [place]. Mr. Mel Almond is a 71 year old man with a new diagnosis of Hodgkin's lymphoma following a 15 day stay at  [facility], he has declined any treatment. PMH includes BPH, melanoma, Baker's cyst and prediabetes. He presented  to the [facility] ED on 8/3 with weakness and "feeling bad". Very poor oral intake, now bed bound, family and patient requesting transfer to the hospice home for end of life care. Patient seen lying in the bed , eyes closed, easily aroused by voice, but drifts back to sleep quickly, voice weak, skin pale, appears slightly jaundice. Foley in place draining dark golden urine. Wife Rosaria Ferries and son Corene Cornea at bedside.  Writer initiated education regarding hospice services, philosophy and team approach to care with good understanding voiced. Questions answered. Consents signed. Family and hospital care team all aware there is no bed available at this time. Patient placed on the Hospice home waiting list. Emotional support provided. Patient information faxed to referral. Will continue to follow through final disposition. Thank you. Flo Shanks RN, BSN, [facility] [place], hospital Liaison [phone] c

## 2016-08-17 NOTE — Care Management Important Message (Signed)
Important Message  Patient Details  Name: Tyler Ford MRN: [id number] Date of Birth: [birth year]   Medicare Important Message Given:  Yes    Shelbie Ammons, RN 08/17/2016, 8:43 AM

## 2016-08-17 NOTE — Plan of Care (Signed)
Problem: Physical Regulation: Goal: Quality of life will improve Outcome: Progressing Patient has been resting comfortably this shift. Family at bedside.

## 2016-08-18 NOTE — Clinical Social Work Note (Signed)
Pt is ready for discharge today and will go to [facility]. Pt and family are aware and agreeable to discharge plan. Report has been called. Discharge packet has been prepared. [facility] will provide transportation. CSW is signing off as no further needs identified.   Darden Dates, MSW, LCSW Clinical Social Worker  [phone]

## 2016-08-18 NOTE — Discharge Summary (Signed)
[place] [facility] [place] NAME: Tyler Ford    MR#:  [id number]  DATE OF BIRTH:  [birth year]  DATE OF ADMISSION:  08/14/2016   ADMITTING PHYSICIAN: Nicholes Mango, MD  DATE OF DISCHARGE: 08/18/2016  PRIMARY CARE PHYSICIAN: Patient, No Pcp Per   ADMISSION DIAGNOSIS:   Hyponatremia [E87.1] Near syncope [R55]  DISCHARGE DIAGNOSIS:   Active Problems:   Lymphoma ([place])   SECONDARY DIAGNOSIS:   Past Medical History:  Diagnosis Date  . Cancer ([place])   . Diabetes mellitus without complication ([place])   . Enlarged prostate   . Hodgkin lymphoma [facility])     HOSPITAL COURSE:   71 year old male with past medical history significant for Hodgkin's lymphoma, hyponatremia, BPH, protein calorie malnutrition, anemia requiring transfusion was recently admitted at [facility] and refused chemotherapy for his lymphoma presents secondary to weakness and opting for hospice.  #1 Hodgkin's lymphoma-seen by oncology, status post biopsy revealing Hodgkin's lymphoma. Enlargement of lymph nodes noted in neck chest, abdomen and pelvis concerning for metastatic disease. -Also there is diffuse multifocal metastatic disease in axillary skeleton based on the PET scan on 07/27/16 -However patient has refused chemotherapy and immunotherapy at this time. -Palliative care consult appreciated.  -Has poor oral intake. Will be transferred to hospice home whenever bed is available  #2 hyponatremia and hypokalemia on admission-since refused treatment and is opting for hospice, no further follow-up labs were done  #3 acute on chronic anemia-required transfusion at [facility] 2 weeks ago. -Hemoglobin on admission was 8.5. Again since hospice candidate, no further blood work has been done based on patient's choices.  #4 severe protein calorie malnutrition-albumin of 2.4, prealbumin was low at [facility] as well. -Also has significant dysphagia. Currently only on liquid diet.  #5  Urinary retention- foley for comfort   Possible discharge to hospice home today if bed available   DISCHARGE CONDITIONS:   Guarded  CONSULTS OBTAINED:    None  DRUG ALLERGIES:   Allergies  Allergen Reactions  . Ciprofloxacin Shortness Of Breath and Rash  . Ibuprofen Rash   DISCHARGE MEDICATIONS:   Allergies as of 08/18/2016      Reactions   Ciprofloxacin Shortness Of Breath, Rash   Ibuprofen Rash      Medication List    STOP taking these medications   finasteride 5 MG tablet Commonly known as:  PROSCAR   gabapentin 100 MG capsule Commonly known as:  NEURONTIN   PEDIALYTE PO   tamsulosin 0.4 MG Caps capsule Commonly known as:  FLOMAX     TAKE these medications   acetaminophen 500 MG tablet Commonly known as:  TYLENOL Take 500 mg by mouth every 6 (six) hours as needed.   LORazepam 1 MG tablet Commonly known as:  ATIVAN Take 1 tablet (1 mg total) by mouth every 6 (six) hours as needed for anxiety.   morphine CONCENTRATE 10 MG/0.5ML Soln concentrated solution Take 0.5 mLs (10 mg total) by mouth every 2 (two) hours as needed for severe pain, anxiety or shortness of breath.   ondansetron 4 MG tablet Commonly known as:  ZOFRAN Take 4 mg by mouth every 8 (eight) hours as needed for nausea or vomiting.        DISCHARGE INSTRUCTIONS:   1. Discharge to Hospice home  DIET:   Full Liquid diet  ACTIVITY:   Bedrest  OXYGEN:   Home Oxygen: No.  Oxygen Delivery: room air  DISCHARGE LOCATION:   Hospice home  If you experience worsening of your admission symptoms, develop shortness of breath, life threatening emergency, suicidal or homicidal thoughts you must seek medical attention immediately by calling 911 or calling your MD immediately  if symptoms less severe.  You Must read complete instructions/literature along with all the possible adverse reactions/side effects for all the Medicines you take and that have been prescribed to you. Take any  new Medicines after you have completely understood and accpet all the possible adverse reactions/side effects.   Please note  You were cared for by a hospitalist during your hospital stay. If you have any questions about your discharge medications or the care you received while you were in the hospital after you are discharged, you can call the unit and asked to speak with the hospitalist on call if the hospitalist that took care of you is not available. Once you are discharged, your primary care physician will handle any further medical issues. Please note that NO REFILLS for any discharge medications will be authorized once you are discharged, as it is imperative that you return to your primary care physician (or establish a relationship with a primary care physician if you do not have one) for your aftercare needs so that they can reassess your need for medications and monitor your lab values.    On the day of Discharge:  VITAL SIGNS:   Blood pressure 90/62, pulse (!) 118, temperature 98.1 F (36.7 C), temperature source Oral, resp. rate 16, height 5\' 9"  (1.753 m), weight 87 kg (191 lb 12.8 oz), SpO2 95 %.  PHYSICAL EXAMINATION:   GENERAL:  71 y.o.-year-old ill appearing patient lying in the bed with no acute distress.  EYES: Pupils equal, round, reactive to light and accommodation. No scleral icterus. Extraocular muscles intact.  HEENT: Head atraumatic, normocephalic. Oropharynx and nasopharynx clear. Dry mucous membranes NECK:  Supple, no jugular venous distention. No thyroid enlargement, no tenderness. Low hoarse voice LUNGS: Normal breath sounds bilaterally, no wheezing, rales,rhonchi or crepitation. No use of accessory muscles of respiration. Decreased bibasilar breath sounds CARDIOVASCULAR: S1, S2 normal. No murmurs, rubs, or gallops.  ABDOMEN: Soft, nontender, nondistended. Bowel sounds present. No organomegaly or mass.  EXTREMITIES: No pedal edema, cyanosis, or clubbing.    NEUROLOGIC: Cranial nerves II through XII are intact. Muscle strength equal in all extremities. Sensation intact. Gait not checked. Global weakness noted PSYCHIATRIC: The patient is alert and oriented x 3.  SKIN: No obvious rash, lesion, or ulcer.     DATA REVIEW:   CBC  Recent Labs Lab 08/14/16 1141  WBC 6.4  HGB 8.5*  HCT 25.3*  PLT 176    Chemistries   Recent Labs Lab 08/14/16 1141  NA 124*  K 4.4  CL 89*  CO2 23  GLUCOSE 225*  BUN 22*  CREATININE 0.90  CALCIUM 8.9  AST 70*  ALT 78*  ALKPHOS 354*  BILITOT 1.5*     Microbiology Results  Results for orders placed or performed during the hospital encounter of 08/14/16  Blood culture (routine x 2)     Status: None (Preliminary result)   Collection Time: 08/14/16 12:39 PM  Result Value Ref Range Status   Specimen Description BLOOD [facility]  Final   Special Requests   Final    BOTTLES DRAWN AEROBIC AND ANAEROBIC Blood Culture adequate volume   Culture NO GROWTH 4 DAYS  Final   Report Status PENDING  Incomplete  Blood culture (routine x 2)     Status: None (Preliminary result)  Collection Time: 08/14/16 12:39 PM  Result Value Ref Range Status   Specimen Description BLOOD [facility]  Final   Special Requests   Final    BOTTLES DRAWN AEROBIC AND ANAEROBIC Blood Culture adequate volume   Culture NO GROWTH 4 DAYS  Final   Report Status PENDING  Incomplete    RADIOLOGY:  No results found.   Management plans discussed with the patient, family and they are in agreement.  CODE STATUS:     Code Status Orders        Start     Ordered   08/14/16 1818  Do not attempt resuscitation (DNR)  Continuous    Question Answer Comment  In the event of cardiac or respiratory ARREST Do not call a "code blue"   In the event of cardiac or respiratory ARREST Do not perform Intubation, CPR, defibrillation or ACLS   In the event of cardiac or respiratory ARREST Use medication by any route, position, wound care, and other measures  to relive pain and suffering. May use oxygen, suction and manual treatment of airway obstruction as needed for comfort.   Comments RN may pronounce      08/14/16 1817    Code Status History    Date Active Date Inactive Code Status Order ID Comments User Context   This patient has a current code status but no historical code status.      TOTAL TIME TAKING CARE OF THIS PATIENT: 37 minutes.    Nasser Ku M.D on 08/18/2016 at 2:27 PM  Between 7am to 6pm - Pager - [phone]  After 6pm go to [email or web address] - Proofreader  [facility] [place] [facility]  Office  [phone]  CC: Primary care physician; Patient, No Pcp Per   Note: This dictation was prepared with Dragon dictation along with smaller phrase technology. Any transcriptional errors that result from this process are unintentional.

## 2016-08-18 NOTE — Progress Notes (Signed)
[place] at [place] NAME: Tyler Ford    MR#:  [id number]  DATE OF BIRTH:  1945/04/26  SUBJECTIVE:  CHIEF COMPLAINT:   Chief Complaint  Patient presents with  . Weakness   -Has  lymphoma with abdominal lymphadenopathy, patient has opted for hospice at this time -Awaiting Hospice home transfer  REVIEW OF SYSTEMS:  Review of Systems  Constitutional: Positive for malaise/fatigue. Negative for chills and fever.  HENT: Positive for sore throat. Negative for congestion, ear discharge, hearing loss and nosebleeds.   Respiratory: Positive for shortness of breath. Negative for cough and wheezing.   Cardiovascular: Negative for chest pain, palpitations and leg swelling.  Gastrointestinal: Negative for abdominal pain, constipation, diarrhea, nausea and vomiting.  Genitourinary: Negative for dysuria.  Musculoskeletal: Positive for myalgias.  Neurological: Positive for weakness. Negative for dizziness, speech change, focal weakness, seizures and headaches.  Psychiatric/Behavioral: Negative for depression.    DRUG ALLERGIES:   Allergies  Allergen Reactions  . Ciprofloxacin Shortness Of Breath and Rash  . Ibuprofen Rash    VITALS:  Blood pressure 90/62, pulse (!) 118, temperature 98.1 F (36.7 C), temperature source Oral, resp. rate 16, height 5\' 9"  (1.753 m), weight 87 kg (191 lb 12.8 oz), SpO2 95 %.  PHYSICAL EXAMINATION:  Physical Exam  GENERAL:  71 y.o.-year-old ill appearing patient lying in the bed with no acute distress.  EYES: Pupils equal, round, reactive to light and accommodation. No scleral icterus. Extraocular muscles intact.  HEENT: Head atraumatic, normocephalic. Oropharynx and nasopharynx clear. Dry mucous membranes NECK:  Supple, no jugular venous distention. No thyroid enlargement, no tenderness. Low hoarse voice LUNGS: Normal breath sounds bilaterally, no wheezing, rales,rhonchi or crepitation. No use of accessory  muscles of respiration. Decreased bibasilar breath sounds CARDIOVASCULAR: S1, S2 normal. No murmurs, rubs, or gallops.  ABDOMEN: Soft, nontender, nondistended. Bowel sounds present. No organomegaly or mass.  EXTREMITIES: No pedal edema, cyanosis, or clubbing.  NEUROLOGIC: Cranial nerves II through XII are intact. Muscle strength 5/5 in all extremities. Sensation intact. Gait not checked. Global weakness noted PSYCHIATRIC: The patient is alert and oriented x 3.  SKIN: No obvious rash, lesion, or ulcer.    LABORATORY PANEL:   CBC  Recent Labs Lab 08/14/16 1141  WBC 6.4  HGB 8.5*  HCT 25.3*  PLT 176   ------------------------------------------------------------------------------------------------------------------  Chemistries   Recent Labs Lab 08/14/16 1141  NA 124*  K 4.4  CL 89*  CO2 23  GLUCOSE 225*  BUN 22*  CREATININE 0.90  CALCIUM 8.9  AST 70*  ALT 78*  ALKPHOS 354*  BILITOT 1.5*   ------------------------------------------------------------------------------------------------------------------  Cardiac Enzymes  Recent Labs Lab 08/14/16 1141  TROPONINI <0.03   ------------------------------------------------------------------------------------------------------------------  RADIOLOGY:  No results found.  EKG:   Orders placed or performed during the hospital encounter of 08/14/16  . ED EKG  . ED EKG  . ED EKG  . ED EKG    ASSESSMENT AND PLAN:   71 year old male with past medical history significant for Hodgkin's lymphoma, hyponatremia, BPH, protein calorie malnutrition, anemia requiring transfusion was recently admitted at [facility] and refused chemotherapy for his lymphoma presents secondary to weakness and opting for hospice.  #1 Hodgkin's lymphoma-seen by oncology, status post biopsy revealing Hodgkin's lymphoma. Enlargement of lymph nodes noted in neck chest, abdomen and pelvis concerning for metastatic disease. -Also there is diffuse  multifocal metastatic disease in axillary skeleton based on the PET scan on 07/27/16 -However patient has  refused chemotherapy and immunotherapy at this time. -Palliative care consult appreciated.  -Has poor oral intake. Will be transferred to hospice home whenever bed is available  #2 hyponatremia and hypokalemia on admission-since refused treatment and is opting for hospice, no further follow-up labs were done  #3 acute on chronic anemia-required transfusion at [facility] 2 weeks ago. -Hemoglobin on admission was 8.5. Again since hospice candidate, no further blood work has been done based on patient's choices.  #4 severe protein calorie malnutrition-albumin of 2.4, prealbumin was low at [facility] as well. -Also has significant dysphagia. Currently only on liquid diet.  #5 DVT prophylaxis-patient is on comfort care  Palliative care evaluation and possible referral to hospice home    All the records are reviewed and case discussed with Care Management/Social Workerr. Management plans discussed with the patient, family and they are in agreement.  CODE STATUS: DO NOT RESUSCITATE  TOTAL TIME TAKING CARE OF THIS PATIENT: 26 minutes.   POSSIBLE D/C today or tomorrow, DEPENDING ON CLINICAL CONDITION.   Claudette Wermuth M.D on 08/18/2016 at 11:25 AM  Between 7am to 6pm - Pager - [phone]  After 6pm go to [email or web address] - password [email or web address]  Office  [phone]  CC: Primary care physician; Patient, No Pcp Per

## 2016-08-18 NOTE — Progress Notes (Signed)
Follow up visit to new hospice home referral to notify patient and family that a bed has become available. Plan is for discharge this afternoon. Family remains in agreement with transfer. Hospital care team all aware. Report called to the hospice home, EMS to be called at 3:45 pm for transport. Discharge summary faxed to referral. Thank you. Flo Shanks RN, BSN, [facility] [place], [facility] [phone] c

## 2016-08-19 LAB — CULTURE, BLOOD (ROUTINE X 2)
CULTURE: NO GROWTH
Culture: NO GROWTH
SPECIAL REQUESTS: ADEQUATE
Special Requests: ADEQUATE

## 2016-09-12 DEATH — deceased

## 2018-07-20 IMAGING — DX DG CHEST 1V
1 series · 1 of 1 positions shown · non-contrast
Comparison: No recent prior .

CLINICAL DATA: Weakness.  Feeling bad.

EXAM:
CHEST 1 VIEW

[chest ap]
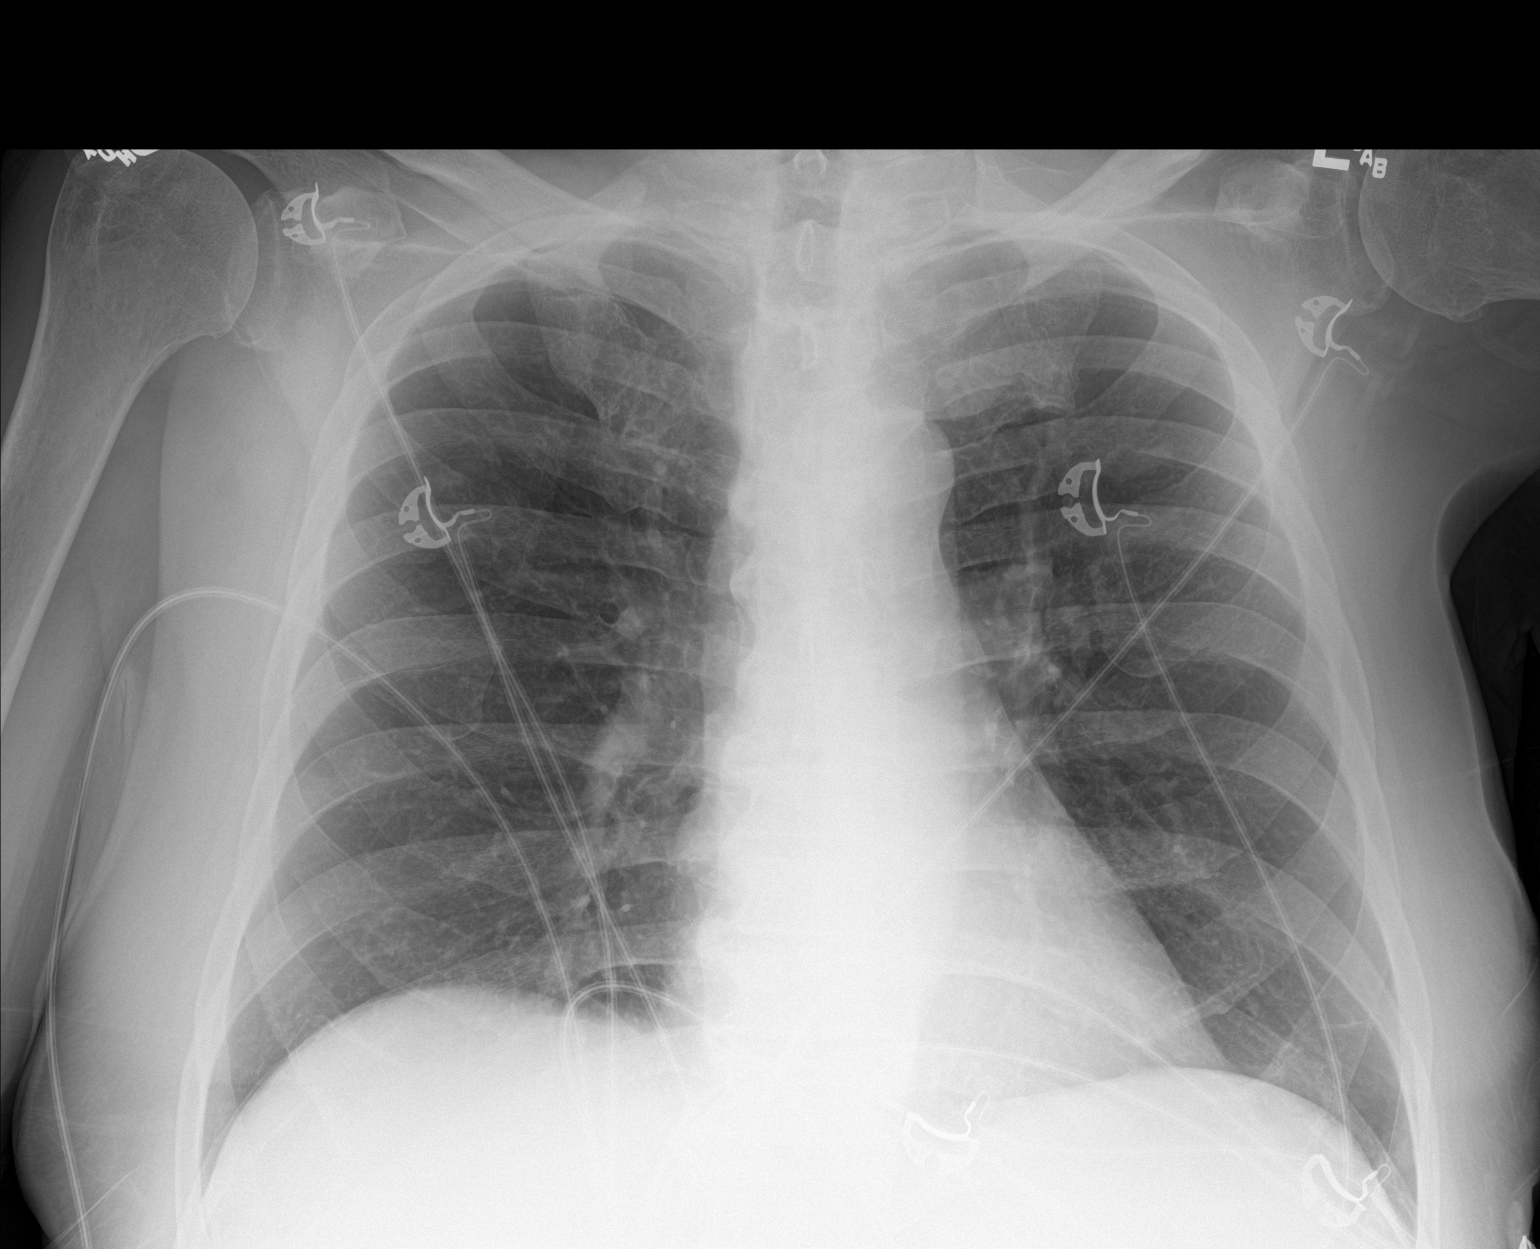

[1 of 1 positions shown; findings below may reference images not displayed]

FINDINGS: Mediastinum and hilar structures normal. Lungs are clear. Heart size
normal. No focal infiltrate. No pleural effusion or pneumothorax .
IMPRESSION: No acute cardiopulmonary disease.
